# Patient Record
Sex: Female | Born: 2005 | Race: White | Hispanic: No | Marital: Single | Smoking: Never smoker
Health system: Southern US, Community
[De-identification: ages and names within clinical notes are randomized; demographics above are authoritative.]

---
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
## 2023-01-25 ENCOUNTER — Telehealth (INDEPENDENT_AMBULATORY_CARE_PROVIDER_SITE_OTHER): Payer: BC Managed Care – PPO | Admitting: Child and Adolescent Psychiatry

## 2023-01-25 DIAGNOSIS — F331 Major depressive disorder, recurrent, moderate: Secondary | ICD-10-CM | POA: Diagnosis not present

## 2023-01-25 DIAGNOSIS — F4010 Social phobia, unspecified: Secondary | ICD-10-CM | POA: Diagnosis not present

## 2023-01-25 DIAGNOSIS — F411 Generalized anxiety disorder: Secondary | ICD-10-CM | POA: Diagnosis not present

## 2023-01-25 NOTE — Progress Notes (Signed)
Virtual Visit via Video Note  I connected with Tammy Dominguez on 01/25/23 at  8:00 AM EDT by a video enabled telemedicine application and verified that I am speaking with the correct person using two identifiers.  Location: Patient: home Provider: office   I discussed the limitations of evaluation and management by telemedicine and the availability of in person appointments. The patient expressed understanding and agreed to proceed.    I discussed the assessment and treatment plan with the patient. The patient was provided an opportunity to ask questions and all were answered. The patient agreed with the plan and demonstrated an understanding of the instructions.   The patient was advised to call back or seek an in-person evaluation if the symptoms worsen or if the condition fails to improve as anticipated.    Tammy Smalling, MD   [facility] MD/PA/NP OP Progress Note  01/25/2023 3:40 PM Tammy Dominguez  MRN:  [id number]  Chief Complaint: Medication management follow-up for anxiety and depression.  HPI:   Tammy Dominguez is an 17 y.o.female who lives in between bio parents(parents recently separated) 12th grader at [employer] Her medical hx is significant of eczema and bronchial asthma and psychiatric history significant of MDD and generalized anxiety disorder, and currently prescribed Cymbalta 60 mg twice a day and BuSpar 10 mg twice a day and trazodone as needed at night for sleep.  Tammy Dominguez was present by herself at her home and was evaluated alone.  With her verbal informed consent, appointment was also attended by rotating medical students.  She was last seen for follow-up about 3 months ago, and had a no-show since the last follow-up appointment.  Today she reports that she has been doing "okay", recently her anxiety has worsened, about few weeks ago she had some technical difficulties with one of the test, ended up receiving 0 which led to worsening of anxiety.  She says that she has  "perfectionist" attitude and receiving 0 made her very anxious and started to have chest discomfort in the context of anxiety.  She says that since last 2 weeks she is doing better with her anxiety, however continues to experience significant anxiety and wonders if medication can be adjusted.  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  She denies any new psychosocial stressors.  We discussed pros and cons of medication adjustments and discussed that we can increase the BuSpar to 10 mg 3 times a day.  She would like to try, discussed to continue with the same dose of Cymbalta.  She has been seeing her therapist about every 2 weeks and recommended to see her more frequently.  She will follow-up again in about 6 weeks or earlier if needed.  She is scheduled to graduate in about 1 month and already accepted admission at [employer] for next fall.  Visit Diagnosis:    ICD-10-CM   1. Generalized anxiety disorder  F41.1     2. Social anxiety disorder  F40.10     3. Moderate episode of recurrent major depressive disorder  F33.1         Past Psychiatric History:  Inpatient: None reported RTC: None Outpatient:     - Meds: Zoloft up to 50 mg once a day, stopped because it worsened her symptoms.  Celexa up  to 20 mg once a day - stopped because of worsening of symptoms; currently taking Cymbalta 60 mg bID an and BuSpar 10 mg twice a day    - Therapy: Has been seeing Ms. Bosie Clos, for the past 3 months through [employer] employee assistance program.  She sees therapist irregularly, sometimes every week and sometimes every month depending on therapist availability.  Does have a history of previous therapy after her sister was hospitalized for psychiatric reason 5 years ago.  She saw a therapist briefly  individually and with family. No seeing therapist at [facility] - weekly.  Hx of SI/HI: None reported  Past Medical History: No past medical history on file. No past surgical history on file.  Family Psychiatric History: As mentioned in initial H&P, reviewed today, no change   Family History:  Family History  Problem Relation Age of Onset   Bipolar disorder Sister     Social History:  Social History   Socioeconomic History   Marital status: Single    Spouse name: Not on file   Number of children: Not on file   Years of education: Not on file   Highest education level: 11th grade  Occupational History   Not on file  Tobacco Use   Smoking status: Never   Smokeless tobacco: Never  Vaping Use   Vaping Use: Never used  Substance and Sexual Activity   Alcohol use: Never   Drug use: Never   Sexual activity: Never  Other Topics Concern   Not on file  Social History Narrative   Not on file   Social Determinants of Health   Financial Resource Strain: Not on file  Food Insecurity: Not on file  Transportation Needs: Not on file  Physical Activity: Not on file  Stress: Not on file  Social Connections: Not on file    Allergies: No Known Allergies  Metabolic Disorder Labs: No results found for: "HGBA1C", "MPG" No results found for: "PROLACTIN" No results found for: "CHOL", "TRIG", "HDL", "CHOLHDL", "VLDL", "LDLCALC" Lab Results  Component Value Date   TSH 1.600 01/07/2022    Therapeutic Level Labs: No results found for: "LITHIUM" No results found for: "VALPROATE" No results found for: "CBMZ"  Current Medications: Current Outpatient Medications  Medication Sig Dispense Refill   busPIRone (BUSPAR) 10 MG tablet TAKE 1 TABLET BY MOUTH TWICE A DAY 180 tablet 1   DULoxetine (CYMBALTA) 60 MG capsule TAKE 1 CAPSULE BY MOUTH 2 TIMES DAILY. 180 capsule 1   traZODone (DESYREL) 50 MG tablet TAKE 1-1.5 TABLETS (50-75 MG TOTAL) BY MOUTH AT BEDTIME AS NEEDED FOR SLEEP. 135 tablet  1   No current facility-administered medications for this visit.     Musculoskeletal: Strength & Muscle Tone:  Unable to assess since appointment was on telemedicine  Gait & Station:  Unable to assess since appointment was on telemedicine Patient leans: N/A  Psychiatric Specialty Exam: Review of Systems  There were no vitals taken for this visit.There is no height or weight on file to calculate BMI.  General Appearance: Casual and Well Groomed  Eye Contact:  Good  Speech:  Clear and Coherent and Normal Rate  Volume:  Normal  Mood:   "ok"  Affect:  Appropriate, Congruent, and Restricted  Thought Process:  Goal Directed and Linear  Orientation:  Full (Time, Place, and Person)  Thought Content: Logical   Suicidal Thoughts:  No  Homicidal Thoughts:  No  Memory:  Immediate;   Fair Recent;   Fair Remote;  Fair  Judgement:  Fair  [facility]:  Fair  Psychomotor Activity:  Normal  Concentration:  Concentration: Fair and Attention Span: Fair  Recall:  [employer] of Knowledge: Fair  Language: Fair  Akathisia:  No    AIMS (if indicated): not done  Assets:  Communication Skills Desire for Improvement Financial Resources/Insurance Housing Leisure Time Physical Health Social Support Transportation Vocational/Educational  ADL's:  Intact  Cognition: WNL  Sleep:  Fair   Screenings: GAD-7    Flowsheet Row Video Visit from 04/24/2022 in [facility] Video Visit from 03/13/2022 in [facility] Video Visit from 02/13/2022 in [facility] Office Visit from 01/14/2022 in [facility]  Total GAD-7 Score 11 8 15 11       PHQ2-9    Flowsheet Row Video Visit from 04/24/2022 in [facility] Video Visit from 03/13/2022 in [facility] Video Visit from 02/13/2022  in [facility] Office Visit from 01/14/2022 in [facility] Office Visit from 01/01/2022 in [facility]  PHQ-2 Total Score 3 2 4 4 6   PHQ-9 Total Score 9 6 11 9 15       Flowsheet Row Video Visit from 02/13/2022 in [facility] Office Visit from 01/14/2022 in [facility] Office Visit from 01/01/2022 in [facility]  C-SSRS RISK CATEGORY No Risk No Risk No Risk        Assessment and Plan:   17 year old female with MDD, GAD and Social anxiety disorder with partial improvement in symptoms appears to have continued mild anxiety but seems to be managing it ok, and depressive symptoms seems to be improving. Continues to have multiple psychosocial stressors in her life.  Already on maxed dose Cymbalta 60mg  BID, taking Buspar to 10 mg BID, recently started ind therapy and doing well.   Plan:   Anxiety: - Continue Cymbalta 60 mg BID.  - Increase Buspar to 10 mg three times a day - Continue Trazodone 50 mg QHS PRN for sleep and can take upto 75 mg QHS .  - Ind therapy at [facility], recommend to switch to every week   # Depression -Same as mentioned above   Collaboration of Care: Collaboration of Care: Other N/A   Consent: Patient/Guardian gives verbal consent for treatment and assignment of benefits for services provided during this visit. Patient/Guardian expressed understanding and agreed to proceed.    MDM = 2 or more chronic conditions + med management   Tammy Smalling, MD 01/25/2023, 3:40 PM

## 2023-03-09 ENCOUNTER — Telehealth (INDEPENDENT_AMBULATORY_CARE_PROVIDER_SITE_OTHER): Payer: BC Managed Care – PPO | Admitting: Child and Adolescent Psychiatry

## 2023-03-09 DIAGNOSIS — F411 Generalized anxiety disorder: Secondary | ICD-10-CM | POA: Diagnosis not present

## 2023-03-09 DIAGNOSIS — F4010 Social phobia, unspecified: Secondary | ICD-10-CM

## 2023-03-09 DIAGNOSIS — F3341 Major depressive disorder, recurrent, in partial remission: Secondary | ICD-10-CM | POA: Diagnosis not present

## 2023-03-09 MED ORDER — DULOXETINE HCL 60 MG PO CPEP: 60.0000 mg | ORAL_CAPSULE | Freq: Two times a day (BID) | ORAL | 1 refills | Status: DC

## 2023-03-09 MED ORDER — BUSPIRONE HCL 10 MG PO TABS: 10.0000 mg | ORAL_TABLET | Freq: Three times a day (TID) | ORAL | 1 refills | Status: DC

## 2023-03-09 NOTE — Progress Notes (Signed)
Virtual Visit via Video Note  I connected with Tammy Dominguez on 03/09/23 at 10:30 AM EDT by a video enabled telemedicine application and verified that I am speaking with the correct person using two identifiers.  Location: Patient: home Provider: office   I discussed the limitations of evaluation and management by telemedicine and the availability of in person appointments. The patient expressed understanding and agreed to proceed.    I discussed the assessment and treatment plan with the patient. The patient was provided an opportunity to ask questions and all were answered. The patient agreed with the plan and demonstrated an understanding of the instructions.   The patient was advised to call back or seek an in-person evaluation if the symptoms worsen or if the condition fails to improve as anticipated.    [name], MD   [facility] MD/PA/NP OP Progress Note  03/09/2023 12:26 PM GRACEANN LOLLIE  MRN:  [id number]  Chief Complaint: Medication management follow-up for anxiety and depression.  HPI:   TOMIKA SORTO is an 17 y.o.female who lives in between bio parents(parents recently separated) 12th grader at [employer]. Her medical hx is significant of eczema and bronchial asthma and psychiatric history significant of MDD and generalized anxiety disorder, and currently prescribed Cymbalta 60 mg twice a day and BuSpar 10 mg twice a day and trazodone as needed at night for sleep.  Keyle was present by herself and was evaluated alone.  She denies any new concerns for today's appointment and reports that overall she has been doing well.  She reports that she finished her school, graduated high school, and since about last week she is spending time relaxing at home, reading books.  She says that she plans to do some more jobs and will start her college at [employer] in about 2 months.  She says that overall her anxiety has been better as well as her mood.  Anxiety intermittently gets  worse in the context of some family related situations.  She says that yesterday she spoke about recent conflict with one of her sister in her therapy session and that led to more anxiety, doing better today.  She says that the increased dose of BuSpar was helpful.  She denies any low lows, and describes her mood as "good" overall.  She denies any SI or HI.  We discussed to continue with current medications because of her improvement in follow-up in about 2 months or early if needed.  She continues to see a therapist and recommended to continue.  Visit Diagnosis:    ICD-10-CM   1. Generalized anxiety disorder  F41.1 busPIRone (BUSPAR) 10 MG tablet    DULoxetine (CYMBALTA) 60 MG capsule    2. Social anxiety disorder  F40.10 busPIRone (BUSPAR) 10 MG tablet    DULoxetine (CYMBALTA) 60 MG capsule    3. Recurrent major depressive disorder, in partial remission (HCC)  F33.41 busPIRone (BUSPAR) 10 MG tablet    DULoxetine (CYMBALTA) 60 MG capsule         Past Psychiatric History:  Inpatient: None reported RTC: None Outpatient:     - Meds: Zoloft up to 50 mg once a day, stopped because it worsened her symptoms.  Celexa up to 20 mg once a day - stopped because of worsening of symptoms; currently taking Cymbalta 60 mg bID an and BuSpar 10 mg twice a day    - Therapy: Has been seeing Ms. Bosie Clos, for the past 3 months through mother's  employee assistance program.  She sees therapist irregularly, sometimes every week and sometimes every month depending on therapist availability.  Does have a history of previous therapy after her sister was hospitalized for psychiatric reason 5 years ago.  She saw a therapist briefly individually and with family. No seeing therapist at [facility] - weekly.  Hx of SI/HI: None reported  Past Medical History: No past medical history on file. No past surgical history on file.  Family Psychiatric History: As mentioned in initial H&P, reviewed today, no change   Family  History:  Family History  Problem Relation Age of Onset   Bipolar disorder Sister     Social History:  Social History   Socioeconomic History   Marital status: Single    Spouse name: Not on file   Number of children: Not on file   Years of education: Not on file   Highest education level: 11th grade  Occupational History   Not on file  Tobacco Use   Smoking status: Never   Smokeless tobacco: Never  Vaping Use   Vaping Use: Never used  Substance and Sexual Activity   Alcohol use: Never   Drug use: Never   Sexual activity: Never  Other Topics Concern   Not on file  Social History Narrative   Not on file   Social Determinants of Health   Financial Resource Strain: Not on file  Food Insecurity: Not on file  Transportation Needs: Not on file  Physical Activity: Not on file  Stress: Not on file  Social Connections: Not on file    Allergies: No Known Allergies  Metabolic Disorder Labs: No results found for: "HGBA1C", "MPG" No results found for: "PROLACTIN" No results found for: "CHOL", "TRIG", "HDL", "CHOLHDL", "VLDL", "LDLCALC" Lab Results  Component Value Date   TSH 1.600 01/07/2022    Therapeutic Level Labs: No results found for: "LITHIUM" No results found for: "VALPROATE" No results found for: "CBMZ"  Current Medications: Current Outpatient Medications  Medication Sig Dispense Refill   busPIRone (BUSPAR) 10 MG tablet Take 1 tablet (10 mg total) by mouth 3 (three) times daily. 270 tablet 1   DULoxetine (CYMBALTA) 60 MG capsule Take 1 capsule (60 mg total) by mouth 2 (two) times daily. 180 capsule 1   traZODone (DESYREL) 50 MG tablet TAKE 1-1.5 TABLETS (50-75 MG TOTAL) BY MOUTH AT BEDTIME AS NEEDED FOR SLEEP. 135 tablet 1   No current facility-administered medications for this visit.     Musculoskeletal: Strength & Muscle Tone:  Unable to assess since appointment was on telemedicine  Gait & Station:  Unable to assess since appointment was on  telemedicine Patient leans: N/A  Psychiatric Specialty Exam: Review of Systems  There were no vitals taken for this visit.There is no height or weight on file to calculate BMI.  General Appearance: Casual and Well Groomed  Eye Contact:  Good  Speech:  Clear and Coherent and Normal Rate  Volume:  Normal  Mood:   "ok"  Affect:  Appropriate, Congruent, and Restricted  Thought Process:  Goal Directed and Linear  Orientation:  Full (Time, Place, and Person)  Thought Content: Logical   Suicidal Thoughts:  No  Homicidal Thoughts:  No  Memory:  Immediate;   Fair Recent;   Fair Remote;   Fair  Judgement:  Fair  [facility]:  Fair  Psychomotor Activity:  Normal  Concentration:  Concentration: Fair and Attention Span: Fair  Recall:  [employer] of Knowledge: Fair  Language: Fair  Akathisia:  No    AIMS (if indicated): not done  Assets:  Communication Skills Desire for Improvement Financial Resources/Insurance Housing Leisure Time Physical Health Social Support Transportation Vocational/Educational  ADL's:  Intact  Cognition: WNL  Sleep:  Fair   Screenings: GAD-7    Flowsheet Row Video Visit from 04/24/2022 in [facility] Video Visit from 03/13/2022 in [facility] Video Visit from 02/13/2022 in [facility] Office Visit from 01/14/2022 in [facility]  Total GAD-7 Score 11 8 15 11       PHQ2-9    Flowsheet Row Video Visit from 04/24/2022 in [facility] Video Visit from 03/13/2022 in [facility] Video Visit from 02/13/2022 in [facility] Office Visit from 01/14/2022 in [facility] Office Visit from 01/01/2022 in [facility]  PHQ-2  Total Score 3 2 4 4 6   PHQ-9 Total Score 9 6 11 9 15       Flowsheet Row Video Visit from 02/13/2022 in [facility] Office Visit from 01/14/2022 in [facility] Office Visit from 01/01/2022 in [facility]  C-SSRS RISK CATEGORY No Risk No Risk No Risk        Assessment and Plan:   17 year old female with MDD, GAD and Social anxiety disorder.  Reviewed response to her current medication regimen she appears to have overall stability with her mood, anxiety and tolerated increased dose of BuSpar well.  Recommending to continue with current medications and therapy.   Plan:   Anxiety: - Continue Cymbalta 60 mg BID.  - Continue with Buspar 10 mg three times a day - Continue Trazodone 50 mg QHS PRN for sleep and can take upto 75 mg QHS .  - Ind therapy at [facility], recommend to switch to every week   # Depression -Same as mentioned above   Collaboration of Care: Collaboration of Care: Other N/A   Consent: Patient/Guardian gives verbal consent for treatment and assignment of benefits for services provided during this visit. Patient/Guardian expressed understanding and agreed to proceed.    MDM = 2 or more chronic conditions + med management   [name], MD 03/09/2023, 12:26 PM

## 2023-04-07 ENCOUNTER — Other Ambulatory Visit: Payer: Self-pay | Admitting: Child and Adolescent Psychiatry

## 2023-04-07 DIAGNOSIS — F3341 Major depressive disorder, recurrent, in partial remission: Secondary | ICD-10-CM

## 2023-04-07 DIAGNOSIS — F4010 Social phobia, unspecified: Secondary | ICD-10-CM

## 2023-04-07 DIAGNOSIS — F411 Generalized anxiety disorder: Secondary | ICD-10-CM

## 2023-05-05 IMAGING — CR DG ABDOMEN 1V
2 series · 2 of 2 positions shown · non-contrast
Comparison: Abdominal radiograph dated 02/20/2013.

CLINICAL DATA: Constipation.

EXAM:
ABDOMEN - 1 VIEW

[abdomen kub (1 of 2)]
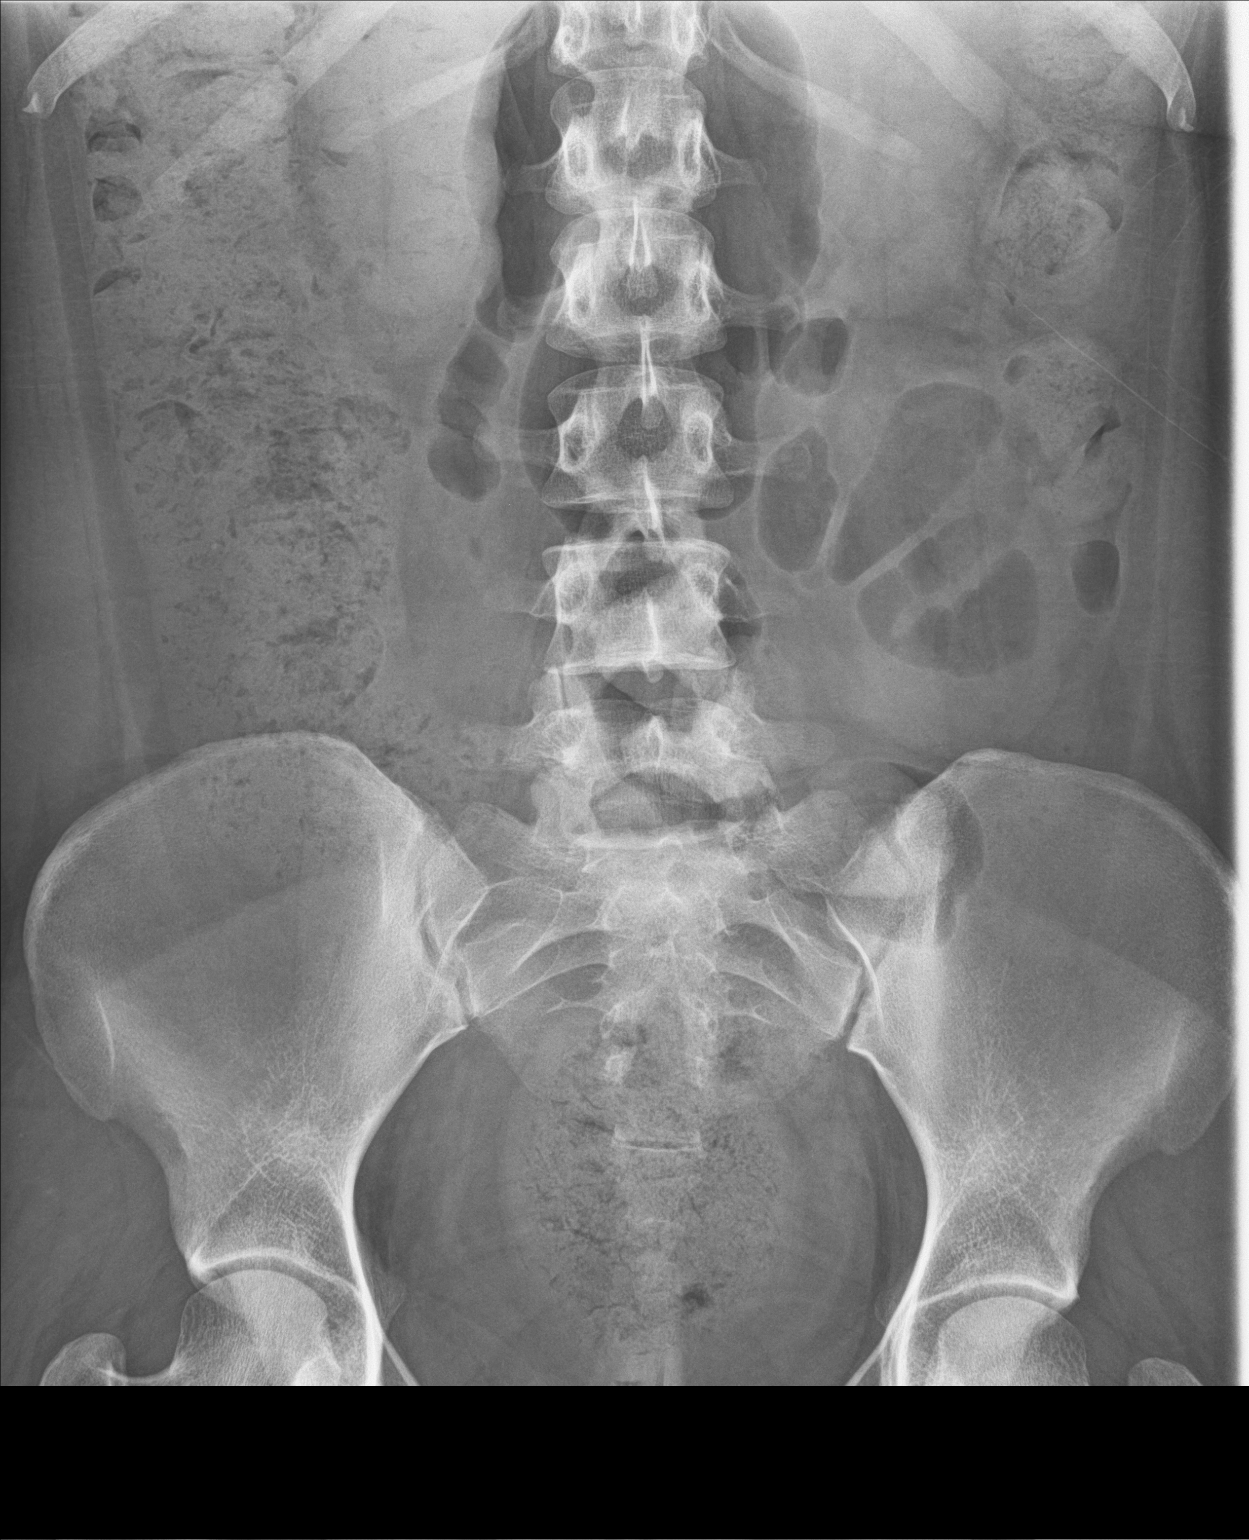

[abdomen kub (2 of 2)]
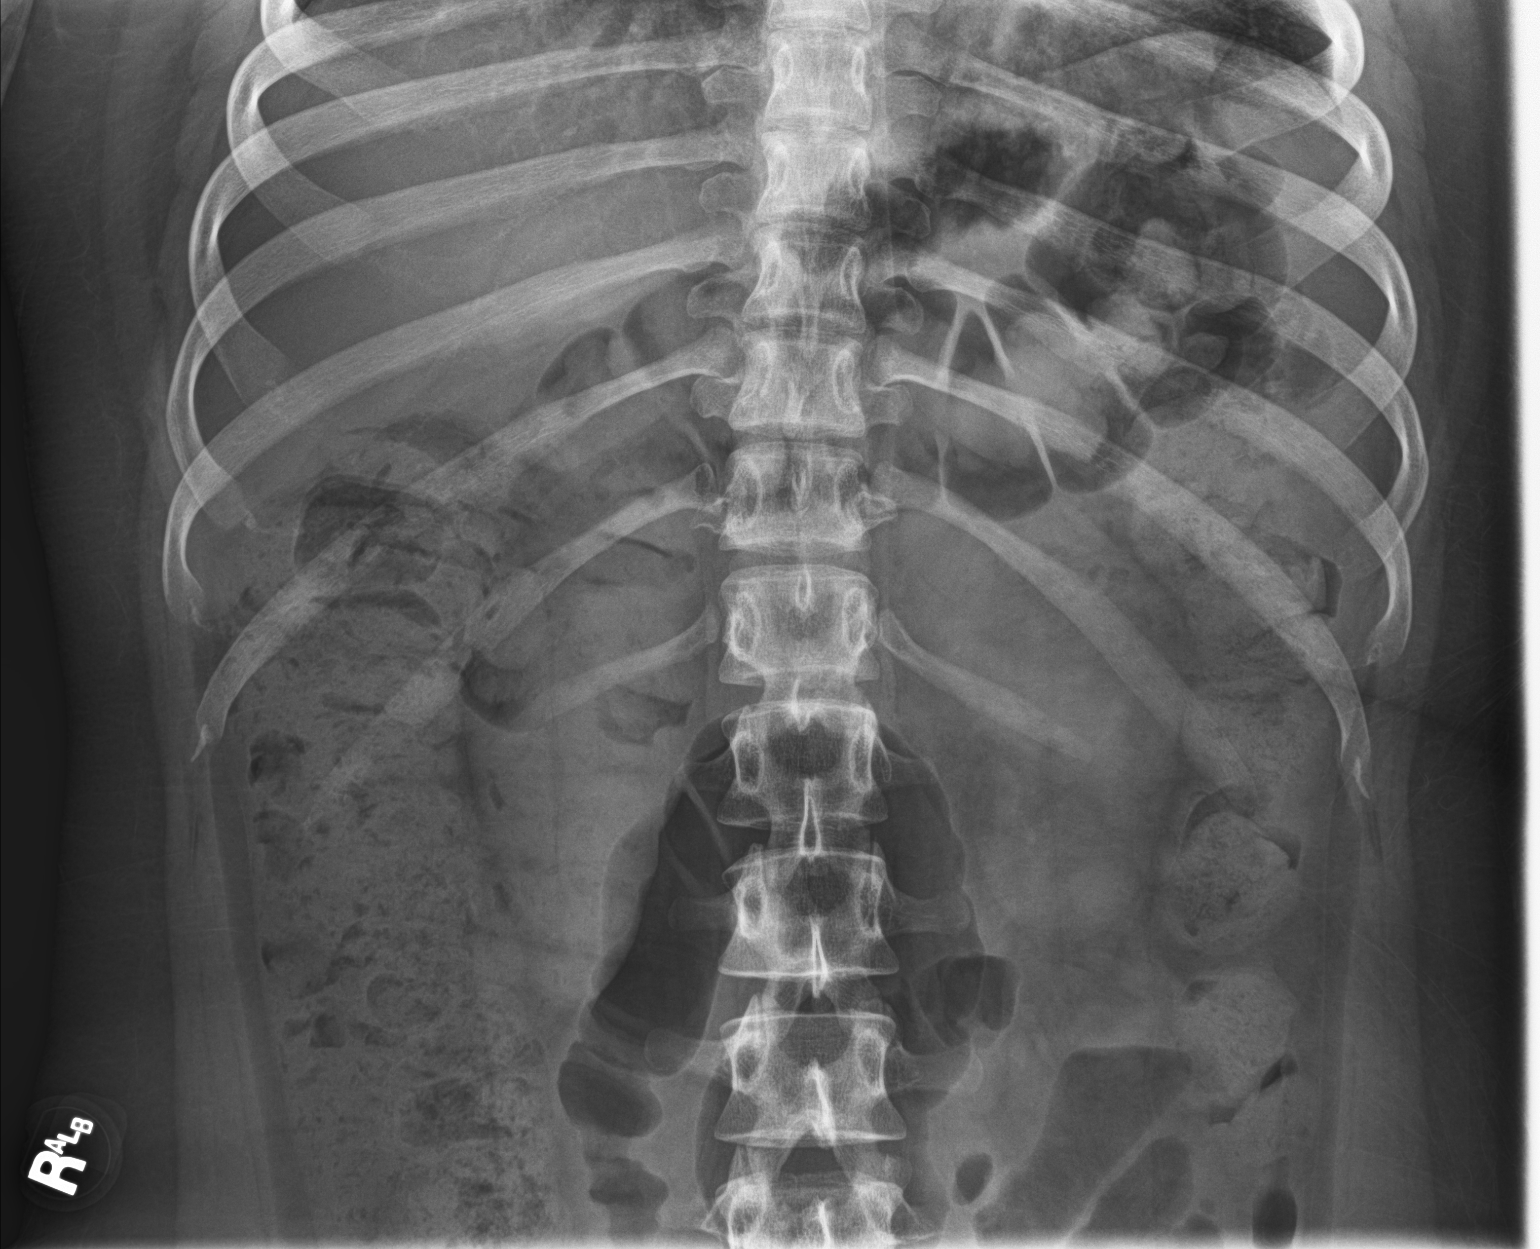

[2 of 2 positions shown; findings below may reference images not displayed]

FINDINGS: Large amount of stool noted throughout the colon and in the rectal
vault. No bowel dilatation or evidence of obstruction. No free air
or radiopaque calculi. The osseous structures are intact. Soft
tissues are unremarkable.
IMPRESSION: Constipation. No bowel obstruction.

## 2023-05-12 ENCOUNTER — Telehealth (INDEPENDENT_AMBULATORY_CARE_PROVIDER_SITE_OTHER): Payer: BC Managed Care – PPO | Admitting: Child and Adolescent Psychiatry

## 2023-05-12 DIAGNOSIS — F3341 Major depressive disorder, recurrent, in partial remission: Secondary | ICD-10-CM | POA: Diagnosis not present

## 2023-05-12 DIAGNOSIS — F4010 Social phobia, unspecified: Secondary | ICD-10-CM | POA: Diagnosis not present

## 2023-05-12 DIAGNOSIS — F411 Generalized anxiety disorder: Secondary | ICD-10-CM | POA: Diagnosis not present

## 2023-05-12 NOTE — Progress Notes (Signed)
Virtual Visit via Video Note  I connected with Tammy Dominguez on 05/12/23 at 10:00 AM EDT by a video enabled telemedicine application and verified that I am speaking with the correct person using two identifiers.  Location: Patient: home Provider: home office   I discussed the limitations of evaluation and management by telemedicine and the availability of in person appointments. The patient expressed understanding and agreed to proceed.    I discussed the assessment and treatment plan with the patient. The patient was provided an opportunity to ask questions and all were answered. The patient agreed with the plan and demonstrated an understanding of the instructions.   The patient was advised to call back or seek an in-person evaluation if the symptoms worsen or if the condition fails to improve as anticipated.    [name], MD   [facility] MD/PA/NP OP Progress Note  05/12/2023 10:26 AM Tammy Dominguez  MRN:  [id number]  Chief Complaint: Medication management follow-up for anxiety and depression.  HPI:   Tammy Dominguez is an 17 y.o.female who lives in between bio parents(parents recently separated) rising freshman at [employer]. Her medical hx is significant of eczema and bronchial asthma and psychiatric history significant of MDD and generalized anxiety disorder, and currently prescribed Cymbalta 60 mg twice a day and BuSpar 10 mg three times a day and trazodone as needed at night for sleep.  Tammy Dominguez was present by herself and was evaluated alone.  She reports that she will be going to college in about 5 days.  She reports that she has mixed feelings associated with going college, reports that she is excited as well as nervous about being in college.  She says that overall she has been doing well over the summer, has had occasional anxious movements as well as depressive movements but she was able to manage them well.  She denies any new concerns for today's appointment.  Reports  that she is doing well at home, her parents are finalizing the divorce and there has still been some contentions around that.  She is spending equal time between each parent.  She denies problems with sleep, appetite or energy.  Denies any SI or HI.  Continues to see her therapist about every 2 weeks and plans to attend virtual therapy sessions from her college.  She reports that she has struggled being fully compliant with her medications but she is working on it and believes that once she is in the routine of college, it will be easier.  We discussed to continue with current medications because of her overall stability and follow-up again in about 6 weeks or earlier if needed.  She verbalized understanding and agreed with this plan.   Visit Diagnosis:    ICD-10-CM   1. Generalized anxiety disorder  F41.1     2. Social anxiety disorder  F40.10     3. Recurrent major depressive disorder, in partial remission (HCC)  F33.41           Past Psychiatric History:  Inpatient: None reported RTC: None Outpatient:     - Meds: Zoloft up to 50 mg once a day, stopped because it worsened her symptoms.  Celexa up to 20 mg once a day - stopped because of worsening of symptoms; currently taking Cymbalta 60 mg bID an and BuSpar 10 mg twice a day    - Therapy: Has been seeing Ms. Bosie Clos, for the past 3 months through [employer] employee assistance program.  She sees therapist irregularly, sometimes every week and sometimes every month depending on therapist availability.  Does have a history of previous therapy after her sister was hospitalized for psychiatric reason 5 years ago.  She saw a therapist briefly individually and with family. No seeing therapist at [facility] - weekly.  Hx of SI/HI: None reported  Past Medical History: No past medical history on file. No past surgical history on file.  Family Psychiatric History: As mentioned in initial H&P, reviewed today, no change   Family History:   Family History  Problem Relation Age of Onset   Bipolar disorder Sister     Social History:  Social History   Socioeconomic History   Marital status: Single    Spouse name: Not on file   Number of children: Not on file   Years of education: Not on file   Highest education level: 11th grade  Occupational History   Not on file  Tobacco Use   Smoking status: Never   Smokeless tobacco: Never  Vaping Use   Vaping status: Never Used  Substance and Sexual Activity   Alcohol use: Never   Drug use: Never   Sexual activity: Never  Other Topics Concern   Not on file  Social History Narrative   Not on file   Social Determinants of Health   Financial Resource Strain: Not on file  Food Insecurity: Not on file  Transportation Needs: Not on file  Physical Activity: Not on file  Stress: Not on file  Social Connections: Not on file    Allergies: No Known Allergies  Metabolic Disorder Labs: No results found for: "HGBA1C", "MPG" No results found for: "PROLACTIN" No results found for: "CHOL", "TRIG", "HDL", "CHOLHDL", "VLDL", "LDLCALC" Lab Results  Component Value Date   TSH 1.600 01/07/2022    Therapeutic Level Labs: No results found for: "LITHIUM" No results found for: "VALPROATE" No results found for: "CBMZ"  Current Medications: Current Outpatient Medications  Medication Sig Dispense Refill   busPIRone (BUSPAR) 10 MG tablet Take 1 tablet (10 mg total) by mouth 3 (three) times daily. 270 tablet 1   DULoxetine (CYMBALTA) 60 MG capsule Take 1 capsule (60 mg total) by mouth 2 (two) times daily. 180 capsule 1   traZODone (DESYREL) 50 MG tablet TAKE 1-1.5 TABLETS (50-75 MG TOTAL) BY MOUTH AT BEDTIME AS NEEDED FOR SLEEP. 135 tablet 1   No current facility-administered medications for this visit.     Musculoskeletal: Strength & Muscle Tone:  Unable to assess since appointment was on telemedicine  Gait & Station:  Unable to assess since appointment was on  telemedicine Patient leans: N/A  Psychiatric Specialty Exam: Review of Systems  There were no vitals taken for this visit.There is no height or weight on file to calculate BMI.  General Appearance: Casual and Well Groomed  Eye Contact:  Good  Speech:  Clear and Coherent and Normal Rate  Volume:  Normal  Mood:   "ok"  Affect:  Appropriate, Congruent, and Restricted  Thought Process:  Goal Directed and Linear  Orientation:  Full (Time, Place, and Person)  Thought Content: Logical   Suicidal Thoughts:  No  Homicidal Thoughts:  No  Memory:  Immediate;   Fair Recent;   Fair Remote;   Fair  Judgement:  Fair  [facility]:  Fair  Psychomotor Activity:  Normal  Concentration:  Concentration: Fair and Attention Span: Fair  Recall:  [employer] of Knowledge: Fair  Language: Fair  Akathisia:  No  AIMS (if indicated): not done  Assets:  Communication Skills Desire for Improvement Financial Resources/Insurance Housing Leisure Time Physical Health Social Support Transportation Vocational/Educational  ADL's:  Intact  Cognition: WNL  Sleep:  Fair   Screenings: GAD-7    Flowsheet Row Video Visit from 04/24/2022 in [facility] Video Visit from 03/13/2022 in [facility] Video Visit from 02/13/2022 in [facility] Office Visit from 01/14/2022 in [facility]  Total GAD-7 Score 11 8 15 11       PHQ2-9    Flowsheet Row Video Visit from 04/24/2022 in [facility] Video Visit from 03/13/2022 in [facility] Video Visit from 02/13/2022 in [facility] Office Visit from 01/14/2022 in [facility] Office Visit from 01/01/2022 in [facility]  PHQ-2  Total Score 3 2 4 4 6   PHQ-9 Total Score 9 6 11 9 15       Flowsheet Row Video Visit from 02/13/2022 in [facility] Office Visit from 01/14/2022 in [facility] Office Visit from 01/01/2022 in [facility]  C-SSRS RISK CATEGORY No Risk No Risk No Risk        Assessment and Plan:   17 year old female with MDD, GAD and Social anxiety disorder.  Reviewed response to her current medications and appears to have overall continued stability in her symptoms. Recommending to continue with current medications and therapy.    Plan:   Anxiety: - Continue Cymbalta 60 mg BID.  - Continue with Buspar 10 mg three times a day - Continue Trazodone 50 mg QHS PRN for sleep and can take upto 75 mg QHS .  - Ind therapy at [facility], recommend to switch to every week   # Depression -Same as mentioned above   Collaboration of Care: Collaboration of Care: Other N/A   Consent: Patient/Guardian gives verbal consent for treatment and assignment of benefits for services provided during this visit. Patient/Guardian expressed understanding and agreed to proceed.    MDM = 2 or more chronic conditions + med management   [name], MD 05/12/2023, 10:26 AM

## 2023-06-19 ENCOUNTER — Other Ambulatory Visit: Payer: Self-pay | Admitting: Child and Adolescent Psychiatry

## 2023-06-19 DIAGNOSIS — F3341 Major depressive disorder, recurrent, in partial remission: Secondary | ICD-10-CM

## 2023-06-19 DIAGNOSIS — F4010 Social phobia, unspecified: Secondary | ICD-10-CM

## 2023-06-19 DIAGNOSIS — F411 Generalized anxiety disorder: Secondary | ICD-10-CM

## 2023-06-24 ENCOUNTER — Telehealth (INDEPENDENT_AMBULATORY_CARE_PROVIDER_SITE_OTHER): Payer: BC Managed Care – PPO | Admitting: Child and Adolescent Psychiatry

## 2023-06-24 DIAGNOSIS — F411 Generalized anxiety disorder: Secondary | ICD-10-CM

## 2023-06-24 DIAGNOSIS — F3341 Major depressive disorder, recurrent, in partial remission: Secondary | ICD-10-CM

## 2023-06-24 DIAGNOSIS — F4010 Social phobia, unspecified: Secondary | ICD-10-CM

## 2023-06-24 MED ORDER — DULOXETINE HCL 60 MG PO CPEP: 60.0000 mg | ORAL_CAPSULE | Freq: Two times a day (BID) | ORAL | 1 refills | Status: DC

## 2023-06-24 MED ORDER — BUSPIRONE HCL 10 MG PO TABS: 10.0000 mg | ORAL_TABLET | Freq: Three times a day (TID) | ORAL | 1 refills | Status: DC

## 2023-06-24 MED ORDER — TRAZODONE HCL 50 MG PO TABS: 50.0000 mg | ORAL_TABLET | Freq: Every evening | ORAL | 1 refills | Status: DC | PRN

## 2023-06-24 NOTE — Progress Notes (Signed)
Virtual Visit via Video Note  I connected with Tammy Dominguez on 06/24/23 at  9:00 AM EDT by a video enabled telemedicine application and verified that I am speaking with the correct person using two identifiers.  Location: Patient: home Provider: home office   I discussed the limitations of evaluation and management by telemedicine and the availability of in person appointments. The patient expressed understanding and agreed to proceed.    I discussed the assessment and treatment plan with the patient. The patient was provided an opportunity to ask questions and all were answered. The patient agreed with the plan and demonstrated an understanding of the instructions.   The patient was advised to call back or seek an in-person evaluation if the symptoms worsen or if the condition fails to improve as anticipated.    Tammy Smalling, MD   [facility] MD/PA/NP OP Progress Note  06/24/2023 9:30 AM Tammy Dominguez  MRN:  [id number]  Chief Complaint: Medication management follow-up for anxiety and depression.  HPI:   Tammy Dominguez is a 17 y.o.female who is now domiciled at [employer], freshman at [employer]. Her medical hx is significant of eczema and bronchial asthma and psychiatric history significant of MDD and generalized anxiety disorder, and currently prescribed Cymbalta 60 mg twice a day and BuSpar 10 mg three times a day and trazodone as needed at night for sleep.  Rihannah was present by herself and was evaluated alone.  She reported that she has been doing well, now in college since last 1 month, he is taking 5 classes and he is able to manage her course load.  She reported that initially she was anxious and homesick in the context of adjustment to the college however she has been doing much better lately.  She reported that her anxiety comes in the context of situations and she is able to manage her anxiety during these times.  She also reported that her mood is  overall stable however on some days in the context of exertion from social life, she may have a day where she is down or not wanting to do things however these are brief and she is able to recover from it quickly.  She denied any SI or HI, continues to sleep well and sometimes takes trazodone, eats well.  She reported that her medications are going well for her and she has been taking it consistently, except on some days she may forget in the context of her being busy with the school schedule.  She reported that in her free time she has been hanging out with her friends, going out on hikes on the weekends.  She will also be working at [employer].  She reported that things are going well at home, her father talks to her every day and is planning to visit her tonight for her birthday.  We discussed to continue with current medications because of her stability with her symptoms and follow-up again in about 2 months or earlier if needed.  Visit Diagnosis:    ICD-10-CM   1. Generalized anxiety disorder  F41.1 DULoxetine (CYMBALTA) 60 MG capsule    busPIRone (BUSPAR) 10 MG tablet    2. Social anxiety disorder  F40.10 DULoxetine (CYMBALTA) 60 MG capsule    busPIRone (BUSPAR) 10 MG tablet    3. Recurrent major depressive disorder, in partial remission (HCC)  F33.41 DULoxetine (CYMBALTA) 60 MG capsule    busPIRone (BUSPAR) 10 MG tablet  Past Psychiatric History:  Inpatient: None reported RTC: None Outpatient:     - Meds: Zoloft up to 50 mg once a day, stopped because it worsened her symptoms.  Celexa up to 20 mg once a day - stopped because of worsening of symptoms; currently taking Cymbalta 60 mg bID an and BuSpar 10 mg twice a day    - Therapy: Has been seeing Ms. Bosie Clos, for the past 3 months through [employer] employee assistance program.  She sees therapist irregularly, sometimes every week and sometimes every month depending on therapist availability.  Does have a history of  previous therapy after her sister was hospitalized for psychiatric reason 5 years ago.  She saw a therapist briefly individually and with family. No seeing therapist at [facility] - weekly.  Hx of SI/HI: None reported  Past Medical History: No past medical history on file. No past surgical history on file.  Family Psychiatric History: As mentioned in initial H&P, reviewed today, no change   Family History:  Family History  Problem Relation Age of Onset   Bipolar disorder Sister     Social History:  Social History   Socioeconomic History   Marital status: Single    Spouse name: Not on file   Number of children: Not on file   Years of education: Not on file   Highest education level: 11th grade  Occupational History   Not on file  Tobacco Use   Smoking status: Never   Smokeless tobacco: Never  Vaping Use   Vaping status: Never Used  Substance and Sexual Activity   Alcohol use: Never   Drug use: Never   Sexual activity: Never  Other Topics Concern   Not on file  Social History Narrative   Not on file   Social Determinants of Health   Financial Resource Strain: Not on file  Food Insecurity: Not on file  Transportation Needs: Not on file  Physical Activity: Not on file  Stress: Not on file  Social Connections: Not on file    Allergies: No Known Allergies  Metabolic Disorder Labs: No results found for: "HGBA1C", "MPG" No results found for: "PROLACTIN" No results found for: "CHOL", "TRIG", "HDL", "CHOLHDL", "VLDL", "LDLCALC" Lab Results  Component Value Date   TSH 1.600 01/07/2022    Therapeutic Level Labs: No results found for: "LITHIUM" No results found for: "VALPROATE" No results found for: "CBMZ"  Current Medications: Current Outpatient Medications  Medication Sig Dispense Refill   busPIRone (BUSPAR) 10 MG tablet Take 1 tablet (10 mg total) by mouth 3 (three) times daily. 270 tablet 1   DULoxetine (CYMBALTA) 60 MG capsule Take 1 capsule (60 mg total) by  mouth 2 (two) times daily. 180 capsule 1   traZODone (DESYREL) 50 MG tablet Take 1-1.5 tablets (50-75 mg total) by mouth at bedtime as needed for sleep. 135 tablet 1   No current facility-administered medications for this visit.     Musculoskeletal: Strength & Muscle Tone:  Unable to assess since appointment was on telemedicine  Gait & Station:  Unable to assess since appointment was on telemedicine Patient leans: N/A  Psychiatric Specialty Exam: Review of Systems  There were no vitals taken for this visit.There is no height or weight on file to calculate BMI.  General Appearance: Casual and Well Groomed  Eye Contact:  Good  Speech:  Clear and Coherent and Normal Rate  Volume:  Normal  Mood:   "ok"  Affect:  Appropriate, Congruent, and Restricted  Thought Process:  Goal Directed and Linear  Orientation:  Full (Time, Place, and Person)  Thought Content: Logical   Suicidal Thoughts:  No  Homicidal Thoughts:  No  Memory:  Immediate;   Fair Recent;   Fair Remote;   Fair  Judgement:  Fair  [facility]:  Fair  Psychomotor Activity:  Normal  Concentration:  Concentration: Fair and Attention Span: Fair  Recall:  [employer] of Knowledge: Fair  Language: Fair  Akathisia:  No    AIMS (if indicated): not done  Assets:  Communication Skills Desire for Improvement Financial Resources/Insurance Housing Leisure Time Physical Health Social Support Transportation Vocational/Educational  ADL's:  Intact  Cognition: WNL  Sleep:  Fair   Screenings: GAD-7    Flowsheet Row Video Visit from 04/24/2022 in [facility] Video Visit from 03/13/2022 in [facility] Video Visit from 02/13/2022 in [facility] Office Visit from 01/14/2022 in [facility]  Total GAD-7 Score 11 8 15 11       PHQ2-9    Flowsheet Row Video Visit from 04/24/2022  in [facility] Video Visit from 03/13/2022 in [facility] Video Visit from 02/13/2022 in [facility] Office Visit from 01/14/2022 in [facility] Office Visit from 01/01/2022 in [facility]  PHQ-2 Total Score 3 2 4 4 6   PHQ-9 Total Score 9 6 11 9 15       Flowsheet Row Video Visit from 02/13/2022 in [facility] Office Visit from 01/14/2022 in [facility] Office Visit from 01/01/2022 in [facility]  C-SSRS RISK CATEGORY No Risk No Risk No Risk        Assessment and Plan:   17 year old female with MDD, GAD and Social anxiety disorder.  Reviewed response to her current medications and she appears to have continued stability with her anxiety and remission and depressive symptoms.  Recommending to continue with current medications and follow-up again in about 2 months or earlier if needed.  She has not seen her therapist since she has been to college but has an appointment tomorrow.   Plan:   Anxiety: - Continue Cymbalta 60 mg BID.  - Continue with Buspar 10 mg three times a day - Continue Trazodone 50 mg QHS PRN for sleep and can take upto 75 mg QHS .  - Ind therapy at [facility], recommend to switch to every week   # Depression -Same as mentioned above   Collaboration of Care: Collaboration of Care: Other N/A   Consent: Patient/Guardian gives verbal consent for treatment and assignment of benefits for services provided during this visit. Patient/Guardian expressed understanding and agreed to proceed.    MDM = 2 or more chronic conditions + med management   Tammy Smalling, MD 06/24/2023, 9:30 AM

## 2023-08-26 ENCOUNTER — Telehealth: Payer: BC Managed Care – PPO | Admitting: Child and Adolescent Psychiatry

## 2023-08-26 DIAGNOSIS — F3341 Major depressive disorder, recurrent, in partial remission: Secondary | ICD-10-CM | POA: Diagnosis not present

## 2023-08-26 DIAGNOSIS — F411 Generalized anxiety disorder: Secondary | ICD-10-CM | POA: Diagnosis not present

## 2023-08-26 DIAGNOSIS — F4010 Social phobia, unspecified: Secondary | ICD-10-CM | POA: Diagnosis not present

## 2023-08-26 NOTE — Progress Notes (Signed)
Virtual Visit via Video Note  I connected with Tammy Dominguez on 08/26/23 at  9:00 AM EST by a video enabled telemedicine application and verified that I am speaking with the correct person using two identifiers.  Location: Patient: home Provider: home office   I discussed the limitations of evaluation and management by telemedicine and the availability of in person appointments. The patient expressed understanding and agreed to proceed.    I discussed the assessment and treatment plan with the patient. The patient was provided an opportunity to ask questions and all were answered. The patient agreed with the plan and demonstrated an understanding of the instructions.   The patient was advised to call back or seek an in-person evaluation if the symptoms worsen or if the condition fails to improve as anticipated.    [name], MD   [facility] MD/PA/NP OP Progress Note  08/26/2023 9:30 AM Tammy Dominguez  MRN:  [id number]  Chief Complaint: Medication management follow-up for anxiety and depression.  HPI:   Tammy Dominguez is a 17 y.o.female who is now domiciled at [employer], freshman at [employer]. Her medical hx is significant of eczema and bronchial asthma and psychiatric history significant of MDD and generalized anxiety disorder, and currently prescribed Cymbalta 60 mg twice a day and BuSpar 10 mg three times a day and trazodone as needed at night for sleep.  Tammy Dominguez was present by herself and was evaluated alone.  She reported that she has been doing "ok", last couple of weeks has been more stressful due to semester coming to an end and she has been feeling more homesick.  She reported that she has noticed herself being more down, that happens about 3 to 4 days intermittently, she has been still able to do her schoolwork, engaged with her friends, sleeping well, appetite has been good and denied any SI or HI.  She reported that she believes that this is because  she has been more homesick and Thanksgiving break as well as winter break would be helpful.  In regards to anxiety she denied excessive worry or anxiety, reported that anxiety is more manageable, anxiety does increase situationally and she has been able to manage it well.  She reported that in her free time she hangs out with her friends, goes out on walks with them etc.  She reported that she has been compliant with her medications and denied any side effects associated with it.  We mutually agreed to continue with current medications and continue with individual therapy and follow-up in about 2 months or earlier if needed.  Visit Diagnosis:    ICD-10-CM   1. Generalized anxiety disorder  F41.1     2. Social anxiety disorder  F40.10     3. Recurrent major depressive disorder, in partial remission (HCC)  F33.41             Past Psychiatric History:  Inpatient: None reported RTC: None Outpatient:     - Meds: Zoloft up to 50 mg once a day, stopped because it worsened her symptoms.  Celexa up to 20 mg once a day - stopped because of worsening of symptoms; currently taking Cymbalta 60 mg bID an and BuSpar 10 mg twice a day    - Therapy: Has been seeing Ms. Bosie Clos, for the past 3 months through [employer] employee assistance program.  She sees therapist irregularly, sometimes every week and sometimes every month depending on therapist availability.  Does have  a history of previous therapy after her sister was hospitalized for psychiatric reason 5 years ago.  She saw a therapist briefly individually and with family. No seeing therapist at [facility] - weekly.  Hx of SI/HI: None reported  Past Medical History: No past medical history on file. No past surgical history on file.  Family Psychiatric History: As mentioned in initial H&P, reviewed today, no change   Family History:  Family History  Problem Relation Age of Onset   Bipolar disorder Sister     Social History:  Social History    Socioeconomic History   Marital status: Single    Spouse name: Not on file   Number of children: Not on file   Years of education: Not on file   Highest education level: 11th grade  Occupational History   Not on file  Tobacco Use   Smoking status: Never   Smokeless tobacco: Never  Vaping Use   Vaping status: Never Used  Substance and Sexual Activity   Alcohol use: Never   Drug use: Never   Sexual activity: Never  Other Topics Concern   Not on file  Social History Narrative   Not on file   Social Determinants of Health   Financial Resource Strain: Not on file  Food Insecurity: Not on file  Transportation Needs: Not on file  Physical Activity: Not on file  Stress: Not on file  Social Connections: Not on file    Allergies: No Known Allergies  Metabolic Disorder Labs: No results found for: "HGBA1C", "MPG" No results found for: "PROLACTIN" No results found for: "CHOL", "TRIG", "HDL", "CHOLHDL", "VLDL", "LDLCALC" Lab Results  Component Value Date   TSH 1.600 01/07/2022    Therapeutic Level Labs: No results found for: "LITHIUM" No results found for: "VALPROATE" No results found for: "CBMZ"  Current Medications: Current Outpatient Medications  Medication Sig Dispense Refill   busPIRone (BUSPAR) 10 MG tablet Take 1 tablet (10 mg total) by mouth 3 (three) times daily. 270 tablet 1   DULoxetine (CYMBALTA) 60 MG capsule Take 1 capsule (60 mg total) by mouth 2 (two) times daily. 180 capsule 1   traZODone (DESYREL) 50 MG tablet Take 1-1.5 tablets (50-75 mg total) by mouth at bedtime as needed for sleep. 135 tablet 1   No current facility-administered medications for this visit.     Musculoskeletal: Strength & Muscle Tone:  Unable to assess since appointment was on telemedicine  Gait & Station:  Unable to assess since appointment was on telemedicine Patient leans: N/A  Psychiatric Specialty Exam: Review of Systems  There were no vitals taken for this visit.There  is no height or weight on file to calculate BMI.  General Appearance: Casual and Well Groomed  Eye Contact:  Good  Speech:  Clear and Coherent and Normal Rate  Volume:  Normal  Mood:   "ok"  Affect:  Appropriate, Congruent, and Restricted  Thought Process:  Goal Directed and Linear  Orientation:  Full (Time, Place, and Person)  Thought Content: Logical   Suicidal Thoughts:  No  Homicidal Thoughts:  No  Memory:  Immediate;   Fair Recent;   Fair Remote;   Fair  Judgement:  Fair  [facility]:  Fair  Psychomotor Activity:  Normal  Concentration:  Concentration: Fair and Attention Span: Fair  Recall:  [employer] of Knowledge: Fair  Language: Fair  Akathisia:  No    AIMS (if indicated): not done  Assets:  Communication Skills Desire for Improvement Financial Resources/Insurance Housing  Leisure Time Physical Health Social Support Transportation Vocational/Educational  ADL's:  Intact  Cognition: WNL  Sleep:  Fair   Screenings: GAD-7    Flowsheet Row Video Visit from 04/24/2022 in [facility] Video Visit from 03/13/2022 in [facility] Video Visit from 02/13/2022 in [facility] Office Visit from 01/14/2022 in [facility]  Total GAD-7 Score 11 8 15 11       PHQ2-9    Flowsheet Row Video Visit from 04/24/2022 in [facility] Video Visit from 03/13/2022 in [facility] Video Visit from 02/13/2022 in [facility] Office Visit from 01/14/2022 in [facility] Office Visit from 01/01/2022 in [facility]  PHQ-2 Total Score 3 2 4 4 6   PHQ-9 Total Score 9 6 11 9 15       Flowsheet Row Video Visit from 02/13/2022 in [facility] Office Visit from 01/14/2022 in [facility] Office Visit from 01/01/2022 in [facility]  C-SSRS RISK CATEGORY No Risk No Risk No Risk        Assessment and Plan:   17 year old female with MDD, GAD and Social anxiety disorder.  Reviewed response to his current medications and she appears to have overall stability with anxiety, intermittent worsening of depressed mood but this does not seem to be consistent with relapsing major depressive disorder.  Recommending to continue with current medications and have regular follow-up with individual therapist.  She verbalized understanding and with this plan.    Plan:   Anxiety: - Continue Cymbalta 60 mg BID.  - Continue with Buspar 10 mg three times a day - Continue Trazodone 50 mg QHS PRN for sleep and can take upto 75 mg QHS .  - Ind therapy at [facility], recommend to switch to every week   # Depression -Same as mentioned above   Collaboration of Care: Collaboration of Care: Other N/A   Consent: Patient/Guardian gives verbal consent for treatment and assignment of benefits for services provided during this visit. Patient/Guardian expressed understanding and agreed to proceed.    MDM = 2 or more chronic conditions + med management   [name], MD 08/26/2023, 9:30 AM

## 2023-10-25 ENCOUNTER — Telehealth (INDEPENDENT_AMBULATORY_CARE_PROVIDER_SITE_OTHER): Payer: Self-pay | Admitting: Child and Adolescent Psychiatry

## 2023-10-25 DIAGNOSIS — F3341 Major depressive disorder, recurrent, in partial remission: Secondary | ICD-10-CM

## 2023-10-25 DIAGNOSIS — F411 Generalized anxiety disorder: Secondary | ICD-10-CM | POA: Diagnosis not present

## 2023-10-25 DIAGNOSIS — F4010 Social phobia, unspecified: Secondary | ICD-10-CM | POA: Diagnosis not present

## 2023-10-25 NOTE — Progress Notes (Signed)
Virtual Visit via Video Note  I connected with Tammy Dominguez on 10/25/23 at  9:00 AM EST by a video enabled telemedicine application and verified that I am speaking with the correct person using two identifiers.  Location: Patient: home Provider: home office   I discussed the limitations of evaluation and management by telemedicine and the availability of in person appointments. The patient expressed understanding and agreed to proceed.    I discussed the assessment and treatment plan with the patient. The patient was provided an opportunity to ask questions and all were answered. The patient agreed with the plan and demonstrated an understanding of the instructions.   The patient was advised to call back or seek an in-person evaluation if the symptoms worsen or if the condition fails to improve as anticipated.    Tammy Smalling, MD   [facility] MD/PA/NP OP Progress Note  10/25/2023 9:29 AM Tammy Dominguez  MRN:  [id number]  Chief Complaint: Medication management follow-up for anxiety and depression.  HPI:   Tammy Dominguez is a 18 y.o.female who is now domiciled at [employer], freshman at [employer]. Her medical hx is significant of eczema and bronchial asthma and psychiatric history significant of MDD and generalized anxiety disorder, and currently prescribed Cymbalta 60 mg twice a day and BuSpar 10 mg three times a day and trazodone as needed at night for sleep.  Tammy Dominguez was present by herself and was evaluated alone.  She reported that she has been doing "good", denied any new concerns for today's appointment and reported that she has been doing well in regards for anxiety and depression.  She rated her anxiety around 5 out of 10, 10 being most anxious, reported that because of school work she may get overwhelmed but she has been able to manage her anxiety well.  She also reported that occasionally her mood may go down in the context of situational stressors for a  brief amount of time but denied any long periods of depressed mood.  She enjoys hanging out with her friends in her free time, enjoys reading.  She is sleeping well, denied problems with appetite, denied SI or HI.  She has been consistently taking her medications and denied any side effects associated with it.  She spent about a month at home with her parents, found it relaxing and enjoyed her time during the winter break.  We discussed to continue with current medications and follow-up again in about 10 to 12 weeks or earlier if needed.  She has not seen her therapist recently because of her busy schedule however plans to restart again.   Visit Diagnosis:    ICD-10-CM   1. Generalized anxiety disorder  F41.1     2. Social anxiety disorder  F40.10     3. Recurrent major depressive disorder, in partial remission (HCC)  F33.41              Past Psychiatric History:  Inpatient: None reported RTC: None Outpatient:     - Meds: Zoloft up to 50 mg once a day, stopped because it worsened her symptoms.  Celexa up to 20 mg once a day - stopped because of worsening of symptoms; currently taking Cymbalta 60 mg bID an and BuSpar 10 mg twice a day    - Therapy: Has been seeing Ms. Tammy Dominguez, for the past 3 months through [employer] employee assistance program.  She sees therapist irregularly, sometimes every week and sometimes every month depending  on therapist availability.  Does have a history of previous therapy after her sister was hospitalized for psychiatric reason 5 years ago.  She saw a therapist briefly individually and with family. No seeing therapist at [facility] - weekly.  Hx of SI/HI: None reported  Past Medical History: No past medical history on file. No past surgical history on file.  Family Psychiatric History: As mentioned in initial H&P, reviewed today, no change   Family History:  Family History  Problem Relation Age of Onset   Bipolar disorder Sister     Social History:   Social History   Socioeconomic History   Marital status: Single    Spouse name: Not on file   Number of children: Not on file   Years of education: Not on file   Highest education level: 11th grade  Occupational History   Not on file  Tobacco Use   Smoking status: Never   Smokeless tobacco: Never  Vaping Use   Vaping status: Never Used  Substance and Sexual Activity   Alcohol use: Never   Drug use: Never   Sexual activity: Never  Other Topics Concern   Not on file  Social History Narrative   Not on file   Social Drivers of Health   Financial Resource Strain: Not on file  Food Insecurity: Not on file  Transportation Needs: Not on file  Physical Activity: Not on file  Stress: Not on file  Social Connections: Not on file    Allergies: No Known Allergies  Metabolic Disorder Labs: No results found for: "HGBA1C", "MPG" No results found for: "PROLACTIN" No results found for: "CHOL", "TRIG", "HDL", "CHOLHDL", "VLDL", "LDLCALC" Lab Results  Component Value Date   TSH 1.600 01/07/2022    Therapeutic Level Labs: No results found for: "LITHIUM" No results found for: "VALPROATE" No results found for: "CBMZ"  Current Medications: Current Outpatient Medications  Medication Sig Dispense Refill   busPIRone (BUSPAR) 10 MG tablet Take 1 tablet (10 mg total) by mouth 3 (three) times daily. 270 tablet 1   DULoxetine (CYMBALTA) 60 MG capsule Take 1 capsule (60 mg total) by mouth 2 (two) times daily. 180 capsule 1   traZODone (DESYREL) 50 MG tablet Take 1-1.5 tablets (50-75 mg total) by mouth at bedtime as needed for sleep. 135 tablet 1   No current facility-administered medications for this visit.     Musculoskeletal: Strength & Muscle Tone:  Unable to assess since appointment was on telemedicine  Gait & Station:  Unable to assess since appointment was on telemedicine Patient leans: N/A  Psychiatric Specialty Exam: Review of Systems  There were no vitals taken for this  visit.There is no height or weight on file to calculate BMI.  General Appearance: Casual and Well Groomed  Eye Contact:  Good  Speech:  Clear and Coherent and Normal Rate  Volume:  Normal  Mood:   "ok"  Affect:  Appropriate, Congruent, and Restricted  Thought Process:  Goal Directed and Linear  Orientation:  Full (Time, Place, and Person)  Thought Content: Logical   Suicidal Thoughts:  No  Homicidal Thoughts:  No  Memory:  Immediate;   Fair Recent;   Fair Remote;   Fair  Judgement:  Fair  [facility]:  Fair  Psychomotor Activity:  Normal  Concentration:  Concentration: Fair and Attention Span: Fair  Recall:  [employer] of Knowledge: Fair  Language: Fair  Akathisia:  No    AIMS (if indicated): not done  Assets:  Manufacturing systems engineer  Desire for Improvement Financial Resources/Insurance Housing Leisure Time Physical Health Social Support Transportation Vocational/Educational  ADL's:  Intact  Cognition: WNL  Sleep:  Fair   Screenings: GAD-7    Flowsheet Row Video Visit from 04/24/2022 in [facility] Video Visit from 03/13/2022 in [facility] Video Visit from 02/13/2022 in [facility] Office Visit from 01/14/2022 in [facility]  Total GAD-7 Score 11 8 15 11       PHQ2-9    Flowsheet Row Video Visit from 04/24/2022 in [facility] Video Visit from 03/13/2022 in [facility] Video Visit from 02/13/2022 in [facility] Office Visit from 01/14/2022 in [facility] Office Visit from 01/01/2022 in [facility]  PHQ-2 Total Score 3 2 4 4 6   PHQ-9 Total Score 9 6 11 9 15       Flowsheet Row Video Visit from 02/13/2022 in [facility] Office Visit from 01/14/2022 in [facility] Office Visit from 01/01/2022 in [facility]  C-SSRS RISK CATEGORY No Risk No Risk No Risk        Assessment and Plan:   18 year old female with MDD, GAD and Social anxiety disorder.  Reviewed response to her current medications and she appears to have overall stability with mood and anxiety, therefore recommending to continue with current medications and follow-up again in about 10 to 12 weeks or earlier if needed.    Plan:   Anxiety:(chronic and stable) - Continue Cymbalta 60 mg BID.  - Continue with Buspar 10 mg three times a day - Continue Trazodone 50 mg QHS PRN for sleep and can take upto 75 mg QHS .  - Ind therapy at [facility], recommend to restart   # Depression (recurrent and in remission) -Same as mentioned above   Collaboration of Care: Collaboration of Care: Other N/A   Consent: Patient/Guardian gives verbal consent for treatment and assignment of benefits for services provided during this visit. Patient/Guardian expressed understanding and agreed to proceed.       Tammy Smalling, MD 10/25/2023, 9:29 AM

## 2024-01-12 ENCOUNTER — Telehealth (INDEPENDENT_AMBULATORY_CARE_PROVIDER_SITE_OTHER): Payer: Self-pay | Admitting: Child and Adolescent Psychiatry

## 2024-01-12 DIAGNOSIS — F3341 Major depressive disorder, recurrent, in partial remission: Secondary | ICD-10-CM | POA: Diagnosis not present

## 2024-01-12 DIAGNOSIS — F4010 Social phobia, unspecified: Secondary | ICD-10-CM | POA: Diagnosis not present

## 2024-01-12 DIAGNOSIS — F411 Generalized anxiety disorder: Secondary | ICD-10-CM

## 2024-01-12 MED ORDER — BUSPIRONE HCL 10 MG PO TABS: 10.0000 mg | ORAL_TABLET | Freq: Three times a day (TID) | ORAL | 1 refills | Status: DC

## 2024-01-12 MED ORDER — DULOXETINE HCL 60 MG PO CPEP: 60.0000 mg | ORAL_CAPSULE | Freq: Two times a day (BID) | ORAL | 1 refills | Status: DC

## 2024-01-12 NOTE — Progress Notes (Signed)
 Virtual Visit via Video Note  I connected with Tammy Dominguez on 01/12/24 at  9:00 AM EDT by a video enabled telemedicine application and verified that I am speaking with the correct person using two identifiers.  Location: Patient: home Provider: home office   I discussed the limitations of evaluation and management by telemedicine and the availability of in person appointments. The patient expressed understanding and agreed to proceed.    I discussed the assessment and treatment plan with the patient. The patient was provided an opportunity to ask questions and all were answered. The patient agreed with the plan and demonstrated an understanding of the instructions.   The patient was advised to call back or seek an in-person evaluation if the symptoms worsen or if the condition fails to improve as anticipated.    Tammy Smalling, MD   [facility] MD/PA/NP OP Progress Note  01/12/2024 9:18 AM Tammy Dominguez  MRN:  [id number]  Chief Complaint: Medication management follow-up for anxiety and depression.  HPI:   Tammy Dominguez is a 18 y.o.female who is now domiciled at [employer], freshman at [employer]. Her medical hx is significant of eczema and bronchial asthma and psychiatric history significant of MDD and generalized anxiety disorder, and currently prescribed Cymbalta 60 mg twice a day and BuSpar 10 mg three times a day and trazodone as needed at night for sleep.  Tammy Dominguez was evaluated alone.  She denied any new concerns for today's appointment.  She reported that she has 2 stressors in her life such as being busy with schoolwork and family stressors around her parents however despite that she has been able to manage her anxiety well.  She rated her anxiety around 5 out of 10, 10 being most anxious, is able to distract herself from the worrying thoughts which has been helpful.  She denied any low lows or depressed mood, reported that there has been some ups and downs of  the mood depending on the situation.  She denied SI or HI, reported that she enjoys hanging out with her friends, they sometimes go on hiking, go to coffee shop.  She reported having a good support system at college to her friends.  She reported that she forgets to take her medication about once a week, we discussed to improve the adherence, she is working on it.  She has been having some difficulties with staying asleep, has been taking trazodone, we discussed that she can use the trazodone from 50 to 75 mg.  She verbalized understanding.  She has been seeing therapist consistently but has an upcoming appointment next week.  We discussed to continue with current medications because of the stability with her symptoms and follow-up in about 3 months or earlier if needed.  Visit Diagnosis:    ICD-10-CM   1. Generalized anxiety disorder  F41.1 busPIRone (BUSPAR) 10 MG tablet    DULoxetine (CYMBALTA) 60 MG capsule    2. Social anxiety disorder  F40.10 busPIRone (BUSPAR) 10 MG tablet    DULoxetine (CYMBALTA) 60 MG capsule    3. Recurrent major depressive disorder, in partial remission (HCC)  F33.41 busPIRone (BUSPAR) 10 MG tablet    DULoxetine (CYMBALTA) 60 MG capsule      Past Psychiatric History:  Inpatient: None reported RTC: None Outpatient:     - Meds: Zoloft up to 50 mg once a day, stopped because it worsened her symptoms.  Celexa up to 20 mg once a day - stopped because  of worsening of symptoms; currently taking Cymbalta 60 mg bID an and BuSpar 10 mg twice a day    - Therapy: Has been seeing Ms. Bosie Clos, for the past 3 months through [employer] employee assistance program.  She sees therapist irregularly, sometimes every week and sometimes every month depending on therapist availability.  Does have a history of previous therapy after her sister was hospitalized for psychiatric reason 5 years ago.  She saw a therapist briefly individually and with family. No seeing therapist at [facility] -  weekly.  Hx of SI/HI: None reported  Past Medical History: No past medical history on file. No past surgical history on file.  Family Psychiatric History: As mentioned in initial H&P, reviewed today, no change   Family History:  Family History  Problem Relation Age of Onset   Bipolar disorder Sister     Social History:  Social History   Socioeconomic History   Marital status: Single    Spouse name: Not on file   Number of children: Not on file   Years of education: Not on file   Highest education level: 11th grade  Occupational History   Not on file  Tobacco Use   Smoking status: Never   Smokeless tobacco: Never  Vaping Use   Vaping status: Never Used  Substance and Sexual Activity   Alcohol use: Never   Drug use: Never   Sexual activity: Never  Other Topics Concern   Not on file  Social History Narrative   Not on file   Social Drivers of Health   Financial Resource Strain: Not on file  Food Insecurity: Not on file  Transportation Needs: Not on file  Physical Activity: Not on file  Stress: Not on file  Social Connections: Not on file    Allergies: No Known Allergies  Metabolic Disorder Labs: No results found for: "HGBA1C", "MPG" No results found for: "PROLACTIN" No results found for: "CHOL", "TRIG", "HDL", "CHOLHDL", "VLDL", "LDLCALC" Lab Results  Component Value Date   TSH 1.600 01/07/2022    Therapeutic Level Labs: No results found for: "LITHIUM" No results found for: "VALPROATE" No results found for: "CBMZ"  Current Medications: Current Outpatient Medications  Medication Sig Dispense Refill   busPIRone (BUSPAR) 10 MG tablet Take 1 tablet (10 mg total) by mouth 3 (three) times daily. 270 tablet 1   DULoxetine (CYMBALTA) 60 MG capsule Take 1 capsule (60 mg total) by mouth 2 (two) times daily. 180 capsule 1   traZODone (DESYREL) 50 MG tablet Take 1-1.5 tablets (50-75 mg total) by mouth at bedtime as needed for sleep. 135 tablet 1   No current  facility-administered medications for this visit.     Musculoskeletal:  Gait & Station: unable to assess since visit was over the telemedicine.  Patient leans: N/A  Psychiatric Specialty Exam: Review of Systems  There were no vitals taken for this visit.There is no height or weight on file to calculate BMI.  General Appearance: Casual and Well Groomed  Eye Contact:  Good  Speech:  Clear and Coherent and Normal Rate  Volume:  Normal  Mood:   "ok"  Affect:  Appropriate, Congruent, and Restricted  Thought Process:  Goal Directed and Linear  Orientation:  Full (Time, Place, and Person)  Thought Content: Logical   Suicidal Thoughts:  No  Homicidal Thoughts:  No  Memory:  Immediate;   Fair Recent;   Fair Remote;   Fair  Judgement:  Fair  [facility]:  Fair  Psychomotor Activity:  Normal  Concentration:  Concentration: Fair and Attention Span: Fair  Recall:  [employer] of Knowledge: Fair  Language: Fair  Akathisia:  No    AIMS (if indicated): not done  Assets:  Communication Skills Desire for Improvement Financial Resources/Insurance Housing Leisure Time Physical Health Social Support Transportation Vocational/Educational  ADL's:  Intact  Cognition: WNL  Sleep:  Fair   Screenings: GAD-7    Flowsheet Row [facility] Video Visit from 03/13/2022 in [facility] Video Visit from 02/13/2022 in [facility] Office Visit from 01/14/2022 in [facility]  Total GAD-7 Score 11 8 15 11       PHQ2-9    Flowsheet Row Video Visit from 04/24/2022 in [facility] Video Visit from 03/13/2022 in [facility] Video Visit from 02/13/2022 in [facility] Office Visit from 01/14/2022 in [facility] Office Visit from 01/01/2022 in [facility]  PHQ-2 Total Score 3 2 4 4 6   PHQ-9 Total Score 9 6 11 9 15       Flowsheet Row Video Visit from 02/13/2022 in [facility] Office Visit from 01/14/2022 in [facility] Office Visit from 01/01/2022 in [facility]  C-SSRS RISK CATEGORY No Risk No Risk No Risk        Assessment and Plan:   19 year old female with MDD, GAD and Social anxiety disorder.  Reviewed response to her current medications and she appears to have continued stability with mood and anxiety despite current stressors.  She is partially adherent to her medication and therefore recommended to improve her medication adherence.  Recommending to continue with current medications and follow-up in about 3 months or earlier if needed.    Plan:   Anxiety:(chronic and stable) - Continue Cymbalta 60 mg BID.  - Continue with Buspar 10 mg three times a day - Continue Trazodone 50 mg QHS PRN for sleep and can take upto 75 mg QHS .  - Ind therapy at [facility], recommend to restart   # Depression (recurrent and in remission) -Same as mentioned above   Collaboration of Care: Collaboration of Care: Other N/A   Consent: Patient/Guardian gives verbal consent for treatment and assignment of benefits for services provided during this visit. Patient/Guardian expressed understanding and agreed to proceed.       Tammy Smalling, MD 01/12/2024, 9:18 AM

## 2024-04-17 ENCOUNTER — Telehealth: Admitting: Child and Adolescent Psychiatry

## 2024-04-17 DIAGNOSIS — F3341 Major depressive disorder, recurrent, in partial remission: Secondary | ICD-10-CM

## 2024-04-17 DIAGNOSIS — F4010 Social phobia, unspecified: Secondary | ICD-10-CM | POA: Diagnosis not present

## 2024-04-17 DIAGNOSIS — F411 Generalized anxiety disorder: Secondary | ICD-10-CM | POA: Diagnosis not present

## 2024-04-17 MED ORDER — BUSPIRONE HCL 10 MG PO TABS: 10.0000 mg | ORAL_TABLET | Freq: Three times a day (TID) | ORAL | 1 refills | Status: DC

## 2024-04-17 MED ORDER — TRAZODONE HCL 50 MG PO TABS: 50.0000 mg | ORAL_TABLET | Freq: Every evening | ORAL | 1 refills | Status: AC | PRN

## 2024-04-17 NOTE — Progress Notes (Signed)
 Virtual Visit via Video Note  I connected with Tammy Dominguez on 04/17/24 at  2:00 PM EDT by a video enabled telemedicine application and verified that I am speaking with the correct person using two identifiers.  Location: Patient: home Provider: home office   I discussed the limitations of evaluation and management by telemedicine and the availability of in person appointments. The patient expressed understanding and agreed to proceed.    I discussed the assessment and treatment plan with the patient. The patient was provided an opportunity to ask questions and all were answered. The patient agreed with the plan and demonstrated an understanding of the instructions.   The patient was advised to call back or seek an in-person evaluation if the symptoms worsen or if the condition fails to improve as anticipated.    Tammy CHRISTELLA Marek, MD   [facility] MD/PA/NP OP Progress Note  04/17/2024 2:43 PM Tammy Dominguez  MRN:  [id number]  Chief Complaint: Medication management follow-up for anxiety and depression.  HPI:   Tammy Dominguez is a 18 y.o.female who is now domiciled at [employer], freshman at [employer]. Her medical hx is significant of eczema and bronchial asthma and psychiatric history significant of MDD and generalized anxiety disorder, and currently prescribed Cymbalta  60 mg twice a day and BuSpar  10 mg three times a day and trazodone  as needed at night for sleep.  Tammy Dominguez was evaluated alone.  She denied any new concerns for today's appointment.  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  She reported that works keep her busy, her anxiety fluctuates intermittently but she is able to manage it well.  She denied any problems with her mood, denied any new psychosocial stressors.  She reported that she has been consistently taking  her medications as prescribed and has been seeing her therapist about every other week.  She denied SI or HI.  We discussed to continue with current medications because of the stability with her symptoms and follow-up in about 3 to 4 months or earlier if needed.  She verbalized understanding and agreed with this plan.   Visit Diagnosis:    ICD-10-CM   1. Generalized anxiety disorder  F41.1 busPIRone  (BUSPAR ) 10 MG tablet    2. Social anxiety disorder  F40.10 busPIRone  (BUSPAR ) 10 MG tablet    3. Recurrent major depressive disorder, in partial remission (HCC)  F33.41 busPIRone  (BUSPAR ) 10 MG tablet       Past Psychiatric History:  Inpatient: None reported RTC: None Outpatient:     - Meds: Zoloft up to 50 mg once a day, stopped because it worsened her symptoms.  Celexa  up to 20 mg once a day - stopped because of worsening of symptoms; currently taking Cymbalta  60 mg bID an and BuSpar  10 mg twice a day    - Therapy: Has been seeing Ms. Bobbette Public, for the past 3 months through [employer] employee assistance program.  She sees therapist irregularly, sometimes every week and sometimes every month depending on therapist availability.  Does have a history of previous therapy after her sister was hospitalized for psychiatric reason 5 years ago.  She saw a therapist briefly individually and with family. No seeing therapist at [facility] - weekly.  Hx of SI/HI: None reported  Past Medical History: No past medical history on file. No past surgical history  on file.  Family Psychiatric History: As mentioned in initial H&P, reviewed today, no change   Family History:  Family History  Problem Relation Age of Onset   Bipolar disorder Sister     Social History:  Social History   Socioeconomic History   Marital status: Single    Spouse name: Not on file   Number of children: Not on file   Years of education: Not on file   Highest education level: 11th grade  Occupational History   Not on file   Tobacco Use   Smoking status: Never   Smokeless tobacco: Never  Vaping Use   Vaping status: Never Used  Substance and Sexual Activity   Alcohol use: Never   Drug use: Never   Sexual activity: Never  Other Topics Concern   Not on file  Social History Narrative   Not on file   Social Drivers of Health   Financial Resource Strain: Not on file  Food Insecurity: Not on file  Transportation Needs: Not on file  Physical Activity: Not on file  Stress: Not on file  Social Connections: Not on file    Allergies: No Known Allergies  Metabolic Disorder Labs: No results found for: HGBA1C, MPG No results found for: PROLACTIN No results found for: CHOL, TRIG, HDL, CHOLHDL, VLDL, LDLCALC Lab Results  Component Value Date   TSH 1.600 01/07/2022    Therapeutic Level Labs: No results found for: LITHIUM No results found for: VALPROATE No results found for: CBMZ  Current Medications: Current Outpatient Medications  Medication Sig Dispense Refill   busPIRone  (BUSPAR ) 10 MG tablet Take 1 tablet (10 mg total) by mouth 3 (three) times daily. 270 tablet 1   DULoxetine  (CYMBALTA ) 60 MG capsule Take 1 capsule (60 mg total) by mouth 2 (two) times daily. 180 capsule 1   traZODone  (DESYREL ) 50 MG tablet Take 1-1.5 tablets (50-75 mg total) by mouth at bedtime as needed for sleep. 135 tablet 1   No current facility-administered medications for this visit.     Musculoskeletal:  Gait & Station: unable to assess since visit was over the telemedicine.  Patient leans: N/A  Psychiatric Specialty Exam: Review of Systems  There were no vitals taken for this visit.There is no height or weight on file to calculate BMI.  General Appearance: Casual and Well Groomed  Eye Contact:  Good  Speech:  Clear and Coherent and Normal Rate  Volume:  Normal  Mood:  good  Affect:  Appropriate, Congruent, and Restricted  Thought Process:  Goal Directed and Linear  Orientation:  Full  (Time, Place, and Person)  Thought Content: Logical   Suicidal Thoughts:  No  Homicidal Thoughts:  No  Memory:  Immediate;   Fair Recent;   Fair Remote;   Fair  Judgement:  Fair  [facility]:  Fair  Psychomotor Activity:  Normal  Concentration:  Concentration: Fair and Attention Span: Fair  Recall:  [employer] of Knowledge: Fair  Language: Fair  Akathisia:  No    AIMS (if indicated): not done  Assets:  Communication Skills Desire for Improvement Financial Resources/Insurance Housing Leisure Time Physical Health Social Support Transportation Vocational/Educational  ADL's:  Intact  Cognition: WNL  Sleep:  Fair   Screenings: GAD-7    Flowsheet Row Video Visit from 04/24/2022 in [facility] Video Visit from 03/13/2022 in [facility] Video Visit from 02/13/2022 in [facility] Office Visit from 01/14/2022 in  [facility]  Total GAD-7 Score 11 8 15 11    PHQ2-9    Flowsheet Row Video Visit from 04/24/2022 in [facility] Video Visit from 03/13/2022 in [facility] Video Visit from 02/13/2022 in [facility] Office Visit from 01/14/2022 in [facility] Office Visit from 01/01/2022 in [facility]  PHQ-2 Total Score 3 2 4 4 6   PHQ-9 Total Score 9 6 11 9 15    Flowsheet Row Video Visit from 02/13/2022 in [facility] Office Visit from 01/14/2022 in [facility] Office Visit from 01/01/2022 in [facility]  C-SSRS RISK CATEGORY No Risk No Risk No Risk     Assessment and Plan:   18 year old female with MDD, GAD and Social  anxiety disorder.  Reviewed response to her current medications and she appears to have continued stability with mood and anxiety and therefore recommending to continue with them.  She will follow up in about 3 months or earlier if needed.    Plan:   Anxiety:(chronic and stable) - Continue Cymbalta  60 mg BID.  - Continue with Buspar  10 mg three times a day - Continue Trazodone  50 mg QHS PRN for sleep and can take upto 75 mg QHS .  - Ind therapy at [facility], recommend to restart   # Depression (recurrent and in remission) -Same as mentioned above   Collaboration of Care: Collaboration of Care: Other N/A   Consent: Patient/Guardian gives verbal consent for treatment and assignment of benefits for services provided during this visit. Patient/Guardian expressed understanding and agreed to proceed.       Tammy CHRISTELLA Marek, MD 04/17/2024, 2:43 PM

## 2024-08-14 ENCOUNTER — Telehealth: Admitting: Child and Adolescent Psychiatry

## 2024-08-14 DIAGNOSIS — F4010 Social phobia, unspecified: Secondary | ICD-10-CM | POA: Diagnosis not present

## 2024-08-14 DIAGNOSIS — F411 Generalized anxiety disorder: Secondary | ICD-10-CM | POA: Diagnosis not present

## 2024-08-14 DIAGNOSIS — F3341 Major depressive disorder, recurrent, in partial remission: Secondary | ICD-10-CM

## 2024-08-14 MED ORDER — DULOXETINE HCL 60 MG PO CPEP
60.0000 mg | ORAL_CAPSULE | Freq: Two times a day (BID) | ORAL | 1 refills | Status: AC
Start: 2024-08-14 — End: ?

## 2024-08-14 MED ORDER — BUSPIRONE HCL 10 MG PO TABS: 10.0000 mg | ORAL_TABLET | Freq: Three times a day (TID) | ORAL | 1 refills | Status: AC

## 2024-08-14 NOTE — Progress Notes (Signed)
 Virtual Visit via Video Note  I connected with Tammy Dominguez on 08/14/24 at  8:30 AM EST by a video enabled telemedicine application and verified that I am speaking with the correct person using two identifiers.  Location: Patient: home Provider: home office   I discussed the limitations of evaluation and management by telemedicine and the availability of in person appointments. The patient expressed understanding and agreed to proceed.    I discussed the assessment and treatment plan with the patient. The patient was provided an opportunity to ask questions and all were answered. The patient agreed with the plan and demonstrated an understanding of the instructions.   The patient was advised to call back or seek an in-person evaluation if the symptoms worsen or if the condition fails to improve as anticipated.    Shelton CHRISTELLA Marek, MD   [facility] MD/PA/NP OP Progress Note  08/14/2024 9:46 AM Tammy Dominguez  MRN:  [id number]  Chief Complaint: Medication management follow-up for anxiety and depression.  HPI:   Tammy Dominguez is a 18 y.o. female who is now domiciled at [employer], Medical [employer] at [employer]. Her medical hx is significant of eczema and bronchial asthma and psychiatric history significant of MDD and generalized anxiety disorder, and currently prescribed Cymbalta  60 mg twice a day and BuSpar  10 mg three times a day and trazodone  as needed at night for sleep.  Ambree was evaluated alone.  She denied any new concerns for today's appointment.  She tells me that she is now a sophomore in college, college has been going well for her, she is taking 5 classes this semester.  She tells me that although college work is stressful and at the end of the week she feels burnt out, she is still able to manage her anxiety and mood well, she goes out with her friends to take a break on the weekends which has been helpful.  She reported that with her medications her anxiety and  mood stays stable, she denies SI or HI, she denies any substance abuse, her sleep can be difficult at times however she has not been taking trazodone  as frequently and now plans to take it more frequently as needed.  We discussed to continue with current medications as prescribed and follow-up again in about 3 months or early if needed.  She continues to see a therapist about every 2 weeks virtually.  She finds it helpful.  Recommended to continue.   Visit Diagnosis:    ICD-10-CM   1. Generalized anxiety disorder  F41.1 DULoxetine  (CYMBALTA ) 60 MG capsule    busPIRone  (BUSPAR ) 10 MG tablet    2. Social anxiety disorder  F40.10 DULoxetine  (CYMBALTA ) 60 MG capsule    busPIRone  (BUSPAR ) 10 MG tablet    3. Recurrent major depressive disorder, in partial remission  F33.41 DULoxetine  (CYMBALTA ) 60 MG capsule    busPIRone  (BUSPAR ) 10 MG tablet        Past Psychiatric History:  Inpatient: None reported RTC: None Outpatient:     - Meds: Zoloft up to 50 mg once a day, stopped because it worsened her symptoms.  Celexa  up to 20 mg once a day - stopped because of worsening of symptoms; currently taking Cymbalta  60 mg bID an and BuSpar  10 mg twice a day    - Therapy: Has been seeing Ms. Bobbette Public, for the past 3 months through [employer] employee assistance program.  She sees therapist irregularly, sometimes every week and sometimes every  month depending on therapist availability.  Does have a history of previous therapy after her sister was hospitalized for psychiatric reason 5 years ago.  She saw a therapist briefly individually and with family. No seeing therapist at [facility] - weekly.  Hx of SI/HI: None reported  Past Medical History: No past medical history on file. No past surgical history on file.  Family Psychiatric History: As mentioned in initial H&P, reviewed today, no change   Family History:  Family History  Problem Relation Age of Onset   Bipolar disorder Sister     Social  History:  Social History   Socioeconomic History   Marital status: Single    Spouse name: Not on file   Number of children: Not on file   Years of education: Not on file   Highest education level: 11th grade  Occupational History   Not on file  Tobacco Use   Smoking status: Never   Smokeless tobacco: Never  Vaping Use   Vaping status: Never Used  Substance and Sexual Activity   Alcohol use: Never   Drug use: Never   Sexual activity: Never  Other Topics Concern   Not on file  Social History Narrative   Not on file   Social Drivers of Health   Financial Resource Strain: Not on file  Food Insecurity: Not on file  Transportation Needs: Not on file  Physical Activity: Not on file  Stress: Not on file  Social Connections: Not on file    Allergies: No Known Allergies  Metabolic Disorder Labs: No results found for: HGBA1C, MPG No results found for: PROLACTIN No results found for: CHOL, TRIG, HDL, CHOLHDL, VLDL, LDLCALC Lab Results  Component Value Date   TSH 1.600 01/07/2022    Therapeutic Level Labs: No results found for: LITHIUM No results found for: VALPROATE No results found for: CBMZ  Current Medications: Current Outpatient Medications  Medication Sig Dispense Refill   busPIRone  (BUSPAR ) 10 MG tablet Take 1 tablet (10 mg total) by mouth 3 (three) times daily. 270 tablet 1   DULoxetine  (CYMBALTA ) 60 MG capsule Take 1 capsule (60 mg total) by mouth 2 (two) times daily. 180 capsule 1   traZODone  (DESYREL ) 50 MG tablet Take 1-1.5 tablets (50-75 mg total) by mouth at bedtime as needed for sleep. 135 tablet 1   No current facility-administered medications for this visit.     Musculoskeletal:  Gait & Station: unable to assess since visit was over the telemedicine.  Patient leans: N/A  Psychiatric Specialty Exam: Review of Systems  There were no vitals taken for this visit.There is no height or weight on file to calculate BMI.   General Appearance: Casual and Well Groomed  Eye Contact:  Good  Speech:  Clear and Coherent and Normal Rate  Volume:  Normal  Mood:  good  Affect:  Appropriate, Congruent, and Restricted  Thought Process:  Goal Directed and Linear  Orientation:  Full (Time, Place, and Person)  Thought Content: Logical   Suicidal Thoughts:  No  Homicidal Thoughts:  No  Memory:  Immediate;   Fair Recent;   Fair Remote;   Fair  Judgement:  Fair  [facility]:  Fair  Psychomotor Activity:  Normal  Concentration:  Concentration: Fair and Attention Span: Fair  Recall:  [employer] of Knowledge: Fair  Language: Fair  Akathisia:  No    AIMS (if indicated): not done  Assets:  Communication Skills Desire for Improvement Financial Resources/Insurance Housing Leisure Time Physical Health Social  Support Transportation Vocational/Educational  ADL's:  Intact  Cognition: WNL  Sleep:  Fair   Screenings: GAD-7    Flowsheet Row Video Visit from 04/24/2022 in [facility] Video Visit from 03/13/2022 in [facility] Video Visit from 02/13/2022 in [facility] Office Visit from 01/14/2022 in [facility]  Total GAD-7 Score 11 8 15 11    PHQ2-9    Flowsheet Row Video Visit from 04/24/2022 in [facility] Video Visit from 03/13/2022 in [facility] Video Visit from 02/13/2022 in [facility] Office Visit from 01/14/2022 in [facility] Office Visit from 01/01/2022 in [facility]  PHQ-2 Total Score 3 2 4 4 6   PHQ-9 Total Score 9 6 11 9 15    Flowsheet Row Video Visit from 02/13/2022 in [facility] Office Visit from 01/14/2022 in  [facility] Office Visit from 01/01/2022 in [facility]  C-SSRS RISK CATEGORY No Risk No Risk No Risk     Assessment and Plan:   18 year old female with MDD, GAD and Social anxiety disorder.  Reviewed response to her current medications and she appears to have continued stability with mood and anxiety therefore recommending to continue with them.      Plan:   Anxiety:(chronic and stable) - Continue Cymbalta  60 mg BID.  - Continue with Buspar  10 mg three times a day - Continue Trazodone  50 mg QHS PRN for sleep and can take upto 75 mg QHS .  - Ind therapy at [facility], recommend to restart   # Depression (recurrent and in remission) -Same as mentioned above   Collaboration of Care: Collaboration of Care: Other N/A   Consent: Patient/Guardian gives verbal consent for treatment and assignment of benefits for services provided during this visit. Patient/Guardian expressed understanding and agreed to proceed.       Shelton CHRISTELLA Marek, MD 08/14/2024, 9:46 AM

## 2024-11-13 ENCOUNTER — Telehealth: Admitting: Child and Adolescent Psychiatry
